# Patient Record
Sex: Male | Born: 2000 | Race: Black or African American | Hispanic: No | Marital: Single | Smoking: Current every day smoker
Health system: Southern US, Community
[De-identification: ages and names within clinical notes are randomized; demographics above are authoritative.]

## PROBLEM LIST (undated history)

## (undated) DIAGNOSIS — F419 Anxiety disorder, unspecified: Secondary | ICD-10-CM

## (undated) HISTORY — PX: NO PAST SURGERIES: SHX2092

---

## 2019-05-21 ENCOUNTER — Other Ambulatory Visit: Payer: Self-pay

## 2019-05-21 ENCOUNTER — Ambulatory Visit
Admission: EM | Admit: 2019-05-21 | Discharge: 2019-05-21 | Disposition: A | Discharge status: Other Acute Inpatient Hospital | Payer: Medicaid Other | Attending: Family Medicine | Admitting: Family Medicine

## 2019-05-21 ENCOUNTER — Ambulatory Visit: Payer: Medicaid Other

## 2019-05-21 ENCOUNTER — Encounter: Payer: Self-pay | Admitting: Emergency Medicine

## 2019-05-21 DIAGNOSIS — S61210A Laceration without foreign body of right index finger without damage to nail, initial encounter: Secondary | ICD-10-CM | POA: Diagnosis not present

## 2019-05-21 DIAGNOSIS — Z23 Encounter for immunization: Secondary | ICD-10-CM | POA: Diagnosis not present

## 2019-05-21 DIAGNOSIS — S62650B Nondisplaced fracture of medial phalanx of right index finger, initial encounter for open fracture: Secondary | ICD-10-CM | POA: Insufficient documentation

## 2019-05-21 HISTORY — DX: Anxiety disorder, unspecified: F41.9

## 2019-05-21 MED ORDER — ACETAMINOPHEN 500 MG PO TABS
1000.0000 mg | ORAL_TABLET | Freq: Once | ORAL | Status: AC
  Administered 2019-05-21: 1000 mg via ORAL

## 2019-05-21 MED ORDER — TETANUS-DIPHTH-ACELL PERTUSSIS 5-2.5-18.5 LF-MCG/0.5 IM SUSP
0.5000 mL | Freq: Once | INTRAMUSCULAR | Status: AC
  Administered 2019-05-21: 0.5 mL via INTRAMUSCULAR

## 2019-05-21 NOTE — ED Provider Notes (Signed)
[facility] ____________________________________________  Time seen: Approximately 2:03 PM  I have reviewed the triage vital signs and the nursing notes.   HISTORY  Chief Complaint Laceration (right index)   HPI Kirk Walls is a 19 y.o. male presenting for evaluation of laceration to right index finger that occurred on a band saw just prior to arrival.  States he had stopped by work and while talking he accidentally placed his hand wrong and was hit by the soft.  Reports last tetanus immunization is greater than 5 years ago.  Reports pain to index finger.  Denies alleviating measures.  Pain worse with direct palpation.  Denies paresthesias or loss of range of motion.  Denies recent sickness or fevers.  Reports otherwise doing well.    Past Medical History:  Diagnosis Date  . Anxiety     There are no problems to display for this patient.   Past Surgical History:  Procedure Laterality Date  . NO PAST SURGERIES       No current facility-administered medications for this encounter. No current outpatient medications on file.  Allergies Patient has no known allergies.  Family History  Problem Relation Age of Onset  . Other Mother        unknown medical history  . Diabetes Father     Social History Social History   Tobacco Use  . Smoking status: Never Smoker  . Smokeless tobacco: Never Used  Substance Use Topics  . Alcohol use: Yes    Comment: rarely  . Drug use: Never    Review of Systems Constitutional: No fever Musculoskeletal: Right finger pain. Skin: Right index finger laceration and pain.  ____________________________________________   PHYSICAL EXAM:  VITAL SIGNS: ED Triage Vitals  Enc Vitals Group     BP 05/21/19 1243 135/84     Pulse Rate 05/21/19 1243 64     Resp 05/21/19 1243 18     Temp 05/21/19 1243 98.3 F (36.8 C)     Temp Source 05/21/19 1243 Oral     SpO2 05/21/19 1243 100 %     Weight 05/21/19 1242 200 lb (90.7  kg)     Height 05/21/19 1242 5\' 7"  (1.702 m)     Head Circumference --      Peak Flow --      Pain Score 05/21/19 1242 8     Pain Loc --      Pain Edu? --      Excl. in GC? --     Constitutional: Alert and oriented. Well appearing and in no acute distress. Eyes: Conjunctivae are normal.  ENT      Head: Normocephalic and atraumatic. Musculoskeletal: Steady..  Neurologic:  Normal speech and language. Skin:  Skin is warm, dry Except: Right index finger dorsal aspect moon shaped laceration extending from middle proximal phalanx to mid middle phalanx with diffuse mild to moderate pain and mild active bleeding, normal distal sensation capillary refill of index finger, good distal resisted flexion without pain, good distal resisted extension but with pain, right hand otherwise nontender. Psychiatric: Mood and affect are normal. Speech and behavior are normal. Patient exhibits appropriate insight and judgment   ___________________________________________   LABS (all labs ordered are listed, but only abnormal results are displayed)  Labs Reviewed - No data to display ____________________________________________  RADIOLOGY  DG Finger Index Right  Result Date: 05/21/2019 CLINICAL DATA:  Laceration with band saw, initial encounter EXAM: RIGHT INDEX FINGER 2+V COMPARISON:  None. FINDINGS: Soft tissue defect is  noted consistent with the given clinical history. On the frontal view a few tiny bony fragments are noted likely related to injury of the distal aspect of the second proximal phalanx. Additionally on the lateral projection there is a defect noted dorsally in the distal aspect of the second proximal phalanx consistent with the recent injury. IMPRESSION: Soft tissue and bony injury as described in the distal aspect of second proximal phalanx. Electronically Signed   By: Inez Catalina M.D.   On: 05/21/2019 13:25   ____________________________________________   PROCEDURES Procedures    INITIAL IMPRESSION / ASSESSMENT AND PLAN / ED COURSE  Pertinent labs & imaging results that were available during my care of the patient were reviewed by me and considered in my medical decision making (see chart for details).  Right index finger open fracture laceration of dominant hand with pain with extension.  Recommend further evaluation in emergency room.  Patient states that he will go to [facility].  Finger dressing applied and finger splint.  Tetanus immunization was updated in urgent care.  Patient states that his friend will drive him to [facility].  Patient agrees with plan and stable at discharge.  ____________________________________________   FINAL CLINICAL IMPRESSION(S) / ED DIAGNOSES  Final diagnoses:  Laceration of right index finger without foreign body without damage to nail, initial encounter  Open nondisplaced fracture of middle phalanx of right index finger, initial encounter     ED Discharge Orders    None       Note: This dictation was prepared with Dragon dictation along with smaller phrase technology. Any transcriptional errors that result from this process are unintentional.         Marylene Land, NP 05/21/19 1446

## 2019-05-21 NOTE — Discharge Instructions (Addendum)
Go directly to emergency room as discussed.  °

## 2019-05-21 NOTE — ED Triage Notes (Signed)
Patient in today after lacerating his right index finger with a band saw. Patient's last Tdap >5 years ago.

## 2020-05-10 ENCOUNTER — Ambulatory Visit: Payer: Medicaid Other | Admitting: Physician Assistant

## 2020-05-10 ENCOUNTER — Other Ambulatory Visit: Payer: Self-pay

## 2020-05-10 DIAGNOSIS — Z113 Encounter for screening for infections with a predominantly sexual mode of transmission: Secondary | ICD-10-CM

## 2020-05-10 DIAGNOSIS — N341 Nonspecific urethritis: Secondary | ICD-10-CM

## 2020-05-10 LAB — GRAM STAIN

## 2020-05-10 MED ORDER — DOXYCYCLINE HYCLATE 100 MG PO TABS: 100.0000 mg | ORAL_TABLET | Freq: Two times a day (BID) | ORAL | 0 refills | Status: AC

## 2020-05-11 ENCOUNTER — Encounter: Payer: Self-pay | Admitting: Physician Assistant

## 2020-05-11 NOTE — Progress Notes (Signed)
[facility] Department STI clinic/screening visit  Subjective:  Steel Kerney is a 20 y.o. male being seen today for an STI screening visit. The patient reports they do not have symptoms.    Patient has the following medical conditions:  There are no problems to display for this patient.    Chief Complaint  Patient presents with  . SEXUALLY TRANSMITTED DISEASE    screening    HPI  Patient reports that he is not having any symptoms but has a new partner and would like a screening.  Denies chronic conditions, surgeries and regular medicines.  States last HIV test was 2 yr ago and last void prior to sample collection for Gram stain was about 1 hr ago.   See flowsheet for further details and programmatic requirements.    The following portions of the patient's history were reviewed and updated as appropriate: allergies, current medications, past medical history, past social history, past surgical history and problem list.  Objective:  There were no vitals filed for this visit.  Physical Exam Constitutional:      General: He is not in acute distress.    Appearance: Normal appearance.  HENT:     Head: Normocephalic and atraumatic.     Comments: No nits,lice, or hair loss. No cervical, supraclavicular or axillary adenopathy.    Mouth/Throat:     Mouth: Mucous membranes are moist.     Pharynx: Oropharynx is clear. No oropharyngeal exudate or posterior oropharyngeal erythema.  Eyes:     Conjunctiva/sclera: Conjunctivae normal.  Pulmonary:     Effort: Pulmonary effort is normal.  Abdominal:     Palpations: Abdomen is soft. There is no mass.     Tenderness: There is no abdominal tenderness. There is no guarding or rebound.  Genitourinary:    Penis: Normal.      Testes: Normal.     Comments: Pubic area without nits, lice, hair loss, edema, erythema, lesions and inguinal adenopathy. Penis circumcised without rash, lesions and discharge at meatus. Testicles  descended bilaterally,nt, no masses or edema. Musculoskeletal:     Cervical back: Neck supple. No tenderness.  Skin:    General: Skin is warm and dry.     Findings: No bruising, erythema, lesion or rash.  Neurological:     Mental Status: He is alert and oriented to person, place, and time.  Psychiatric:        Mood and Affect: Mood normal.        Behavior: Behavior normal.        Thought Content: Thought content normal.        Judgment: Judgment normal.       Assessment and Plan:  Dakhari Zuver is a 20 y.o. male presenting to the [facility] Department for STI screening  1. Screening for STD (sexually transmitted disease) Patient into clinic without symptoms. Reviewed with patient Gram stain results and need for treatment. Rec condoms with all sex. Await test results.  Counseled that RN will call if needs to RTC for treatment once results are back. - Gram stain - Gonococcus culture - HIV/HCV [facility] - Syphilis Serology, [facility] - Gonococcus culture  2. NGU (nongonococcal urethritis) Will treat for NGU with Doxycycline 100 mg #14 1 po BID for 7 days. No sex for 14 days and until after partner completes treatment. Call with questions or concerns. - doxycycline (VIBRA-TABS) 100 MG tablet; Take 1 tablet (100 mg total) by mouth 2 (two) times daily for  7 days.  Dispense: 14 tablet; Refill: 0     No follow-ups on file.  No future appointments.  Matt Holmes, PA

## 2020-05-12 NOTE — Progress Notes (Signed)
Chart reviewed by Pharmacist  Suzanne Walker PharmD, Contract Pharmacist at [facility]  

## 2020-05-14 LAB — GONOCOCCUS CULTURE

## 2020-05-15 LAB — HM HEPATITIS C SCREENING LAB: HM Hepatitis Screen: NEGATIVE

## 2020-05-15 LAB — HM HIV SCREENING LAB: HM HIV Screening: NEGATIVE

## 2020-09-23 IMAGING — CR DG FINGER INDEX 2+V*R*
3 series · 3 of 3 positions shown · non-contrast
Comparison: None.

CLINICAL DATA: Laceration with band saw, initial encounter

EXAM:
RIGHT INDEX FINGER 2+V

[finger ap]
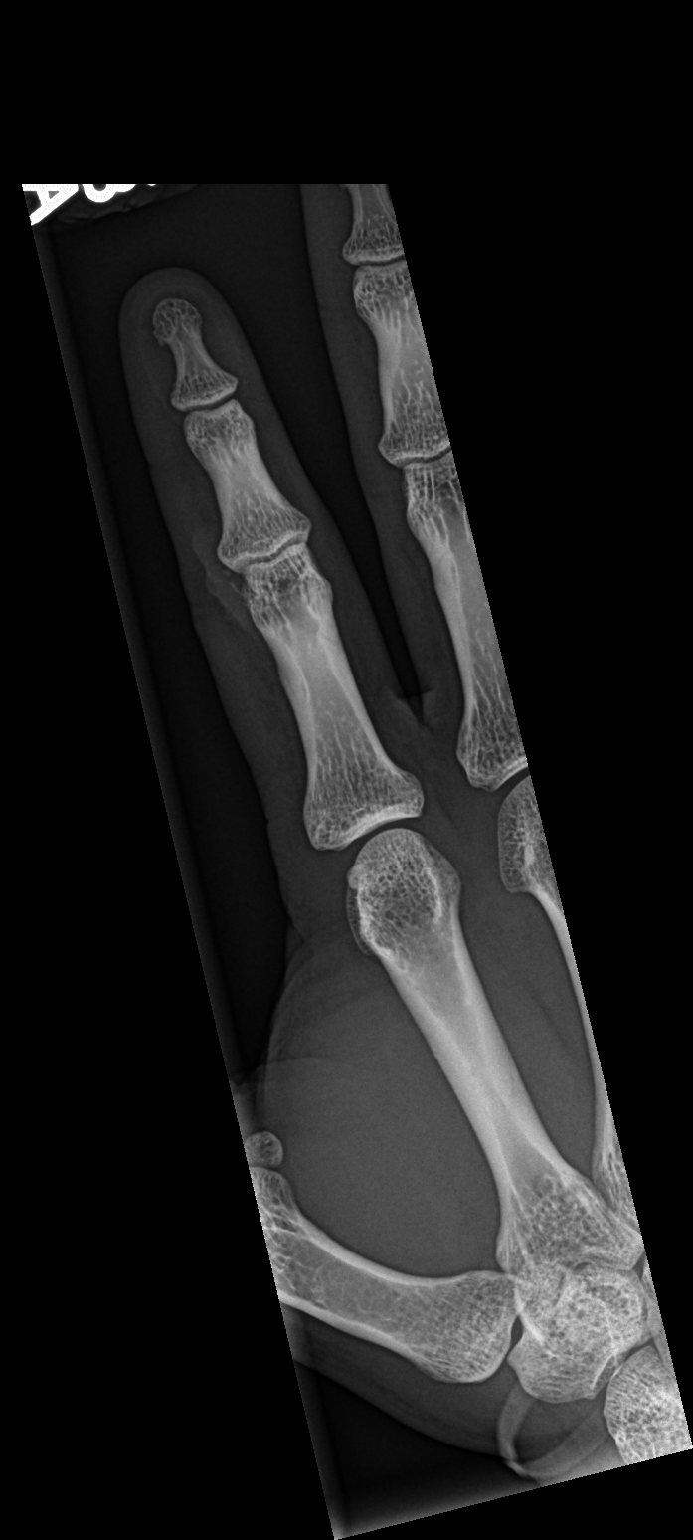

[finger obl]
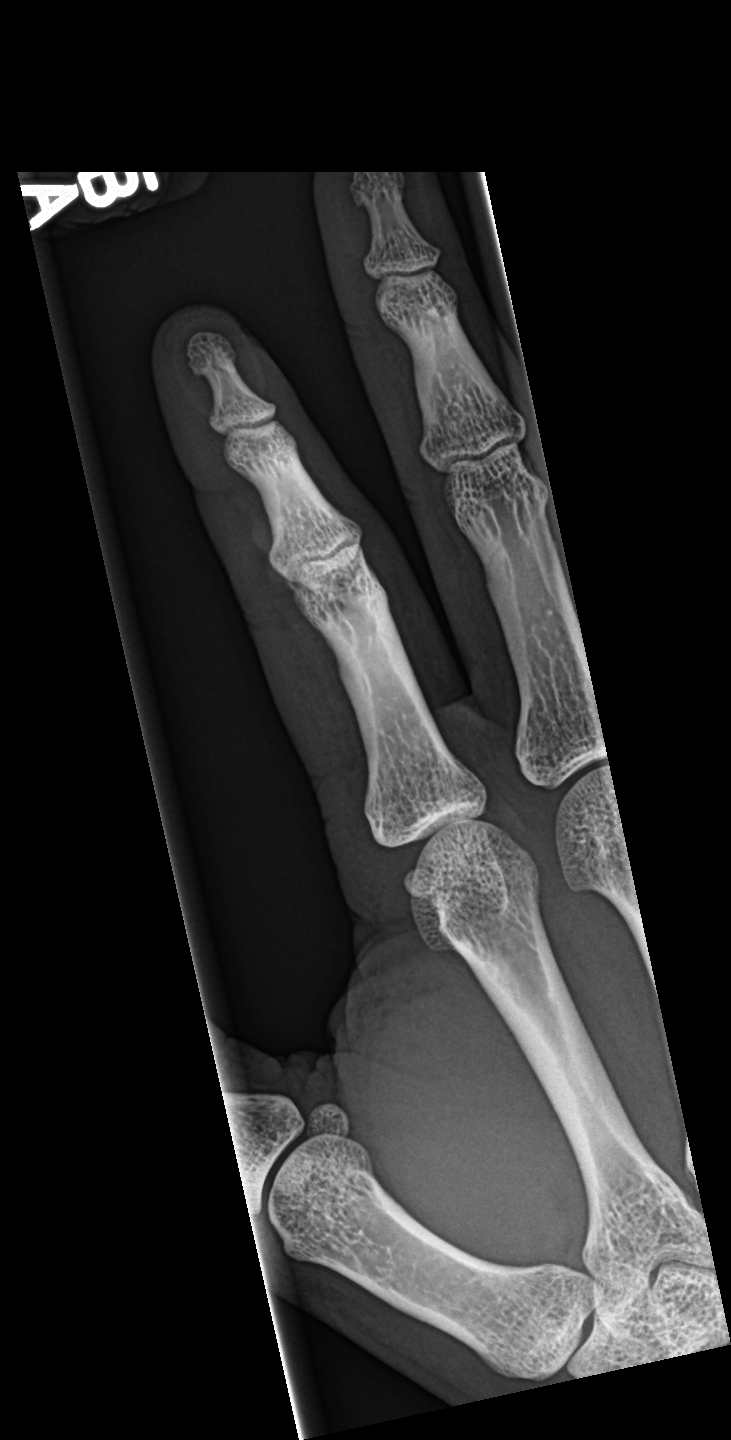

[finger lat]
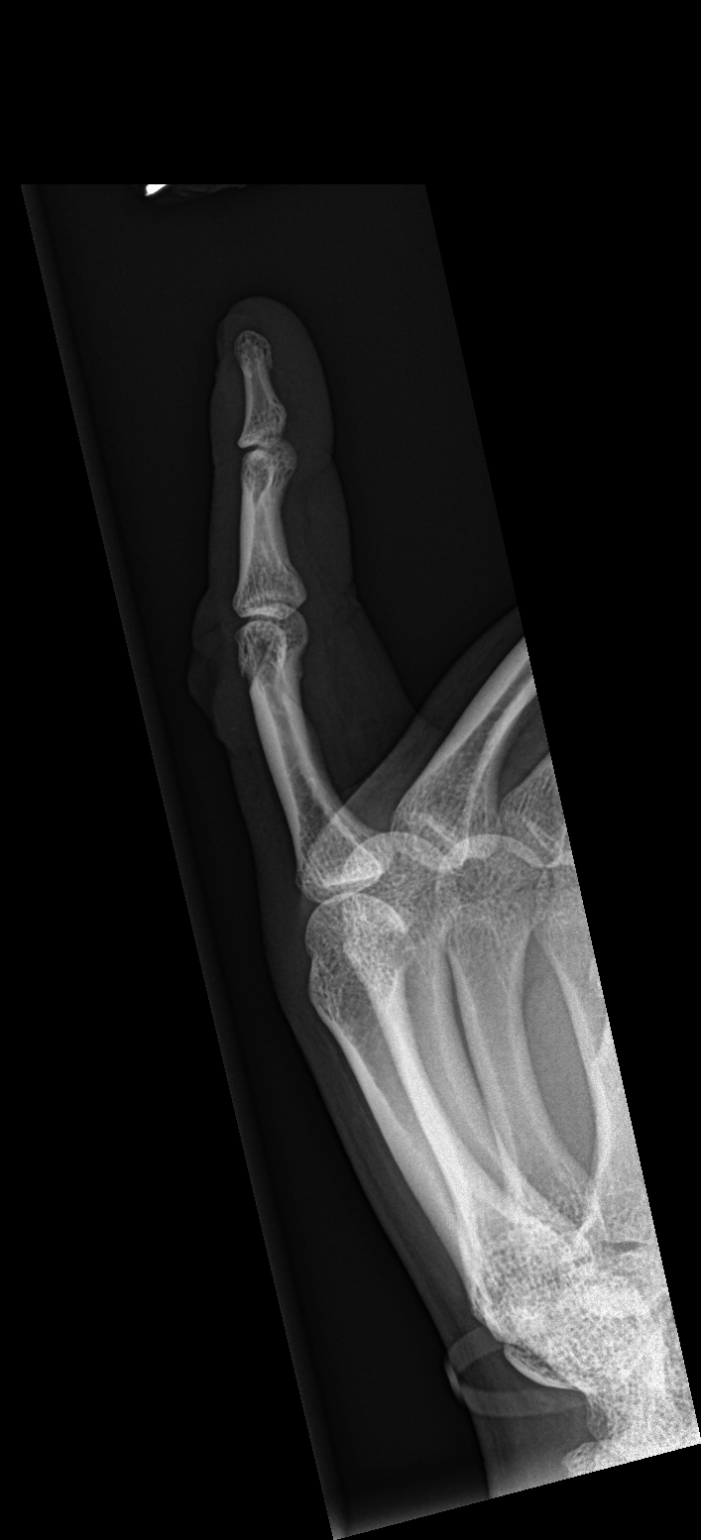

[3 of 3 positions shown; findings below may reference images not displayed]

FINDINGS: Soft tissue defect is noted consistent with the given clinical
history. On the frontal view a few tiny bony fragments are noted
likely related to injury of the distal aspect of the second proximal
phalanx. Additionally on the lateral projection there is a defect
noted dorsally in the distal aspect of the second proximal phalanx
consistent with the recent injury.
IMPRESSION: Soft tissue and bony injury as described in the distal aspect of
second proximal phalanx.

## 2023-02-10 ENCOUNTER — Emergency Department: Payer: Medicaid Other

## 2023-02-10 ENCOUNTER — Emergency Department
Admission: EM | Admit: 2023-02-10 | Discharge: 2023-02-10 | Disposition: A | Discharge status: Home / Self Care | Payer: Medicaid Other | Attending: Emergency Medicine | Admitting: Emergency Medicine

## 2023-02-10 ENCOUNTER — Other Ambulatory Visit: Payer: Self-pay

## 2023-02-10 DIAGNOSIS — K279 Peptic ulcer, site unspecified, unspecified as acute or chronic, without hemorrhage or perforation: Secondary | ICD-10-CM | POA: Diagnosis not present

## 2023-02-10 DIAGNOSIS — Z20822 Contact with and (suspected) exposure to covid-19: Secondary | ICD-10-CM | POA: Insufficient documentation

## 2023-02-10 DIAGNOSIS — R059 Cough, unspecified: Secondary | ICD-10-CM | POA: Diagnosis present

## 2023-02-10 DIAGNOSIS — J209 Acute bronchitis, unspecified: Secondary | ICD-10-CM | POA: Diagnosis not present

## 2023-02-10 LAB — RESP PANEL BY RT-PCR (RSV, FLU A&B, COVID)  RVPGX2
Influenza A by PCR: NEGATIVE
Influenza B by PCR: NEGATIVE
Resp Syncytial Virus by PCR: NEGATIVE
SARS Coronavirus 2 by RT PCR: NEGATIVE

## 2023-02-10 MED ORDER — PANTOPRAZOLE SODIUM 40 MG PO TBEC: 40.0000 mg | DELAYED_RELEASE_TABLET | Freq: Every day | ORAL | 1 refills | Status: AC

## 2023-02-10 MED ORDER — BENZONATATE 100 MG PO CAPS: 100.0000 mg | ORAL_CAPSULE | Freq: Three times a day (TID) | ORAL | 0 refills | Status: AC | PRN

## 2023-02-10 MED ORDER — AZITHROMYCIN 250 MG PO TABS: ORAL_TABLET | ORAL | 0 refills | Status: AC

## 2023-02-10 NOTE — ED Provider Notes (Signed)
 [facility] Provider Note    Event Date/Time   First MD Initiated Contact with Patient 02/10/23 1452     (approximate)   History   Cough and lung pain   HPI  Kirk Walls is a 23 y.o. male complains of long pain for 2 to 3 months along with coughing up yellow phlegm.  States yellow phlegm's been for 2 to 3 days has streaks of blood.  Also some epigastric pain.  States his been going on also for about 3 months.  States stools were dark but he also took Pepto-Bismol.  Denies fever or chills.  No chest pain/shortness of breath at this time.  Patient is vaping      Physical Exam   Triage Vital Signs: ED Triage Vitals  Encounter Vitals Group     BP 02/10/23 1331 124/81     Systolic BP Percentile --      Diastolic BP Percentile --      Pulse Rate 02/10/23 1331 71     Resp 02/10/23 1331 20     Temp 02/10/23 1331 98.2 F (36.8 C)     Temp Source 02/10/23 1331 Oral     SpO2 02/10/23 1331 98 %     Weight 02/10/23 1328 180 lb (81.6 kg)     Height 02/10/23 1328 5' 6 (1.676 m)     Head Circumference --      Peak Flow --      Pain Score 02/10/23 1328 2     Pain Loc --      Pain Education --      Exclude from Growth Chart --     Most recent vital signs: Vitals:   02/10/23 1331  BP: 124/81  Pulse: 71  Resp: 20  Temp: 98.2 F (36.8 C)  SpO2: 98%     General: Awake, no distress.   CV:  Good peripheral perfusion. regular rate and  rhythm Resp:  Normal effort. Lungs CTA Abd:  No distention.  Nontender Other:      ED Results / Procedures / Treatments   Labs (all labs ordered are listed, but only abnormal results are displayed) Labs Reviewed  RESP PANEL BY RT-PCR (RSV, FLU A&B, COVID)  [id number]     EKG     RADIOLOGY Chest x-ray    PROCEDURES:   Procedures Chief Complaint  Patient presents with   Cough   lung pain      MEDICATIONS ORDERED IN ED: Medications - No data to display   IMPRESSION / MDM / ASSESSMENT AND  PLAN / ED COURSE  I reviewed the triage vital signs and the nursing notes.                              Differential diagnosis includes, but is not limited to, COVID, influenza, RSV, CAP, acute bronchitis, lung cancer, peptic ulcer disease, epigastric pain  Patient's presentation is most consistent with acute illness / injury with system symptoms.   Respiratory panel reassuring, chest x-ray independently reviewed interpreted by me as being negative for any acute abnormality  Since the patient appears well, vitals are stable, explained to him his chronic problems with abdominal pain for 3 months along with a cough for 3 months can be addressed by his PCP.  Put in an recommendation for GI consult.  I gave him a prescription for Protonix , Z-Pak, and something for his cough.  He is to follow-up  with his regular doctor or PCP option as given on his discharge papers.  He is in agreement treatment plan.  Discharged stable condition.  Return precautions discussed, vaping cessation discussed      FINAL CLINICAL IMPRESSION(S) / ED DIAGNOSES   Final diagnoses:  Acute bronchitis, unspecified organism  PUD (peptic ulcer disease)     Rx / DC Orders   ED Discharge Orders          Ordered    pantoprazole  (PROTONIX ) 40 MG tablet  Daily        02/10/23 1506    azithromycin  (ZITHROMAX  Z-PAK) 250 MG tablet        02/10/23 1506    benzonatate  (TESSALON  PERLES) 100 MG capsule  3 times daily PRN        02/10/23 1506             Note:  This document was prepared using Dragon voice recognition software and may include unintentional dictation errors.    Gasper Devere ORN, PA-C 02/10/23 1556    Arlander Charleston, MD 02/10/23 [phone]

## 2023-02-10 NOTE — ED Triage Notes (Signed)
 Pt to ED for R lung pain since 2-3 months and coughing yellow phlegm since 2-3 days. No fevers. No SOB. Also states has had intermittent upper abdominal pain that moves around. No abdominal pain at this time. Denies NVD and urinary symptoms. Counseled to quit vaping. Lung pain is worse with coughing, mostly just uncomfortable.

## 2023-03-05 ENCOUNTER — Encounter: Payer: Self-pay | Admitting: Family Medicine

## 2023-03-05 ENCOUNTER — Ambulatory Visit: Payer: Medicaid Other | Admitting: Family Medicine

## 2023-03-05 DIAGNOSIS — Z113 Encounter for screening for infections with a predominantly sexual mode of transmission: Secondary | ICD-10-CM | POA: Diagnosis not present

## 2023-03-05 LAB — HM HIV SCREENING LAB: HM HIV Screening: NEGATIVE

## 2023-03-05 NOTE — Progress Notes (Signed)
 [facility] Department STI clinic [address] [place] [address] Main phone: [phone]  STI screening visit  Subjective:  Kirk Walls is a 23 y.o. male being seen today for an STI screening visit. The patient reports they do not have symptoms.    Patient has the following medical conditions:  There are no active problems to display for this patient.   Chief Complaint  Patient presents with   SEXUALLY TRANSMITTED DISEASE    HPI HPI Patient reports to clinic for STI testing- asymptomatic  STI screening history: Last HIV test per patient/review of record was  Lab Results  Component Value Date   HMHIVSCREEN Negative - Validated 05/15/2020   No results found for: "HIV"  Last HEPC test per patient/review of record was  Lab Results  Component Value Date   HMHEPCSCREEN Negative-Validated 05/15/2020   No components found for: "HEPC"   Last HEPB test per patient/review of record was No components found for: "HMHEPBSCREEN"   Fertility: Does the patient or their partner desires a pregnancy in the next year? No  Screening for MPX risk: Does the patient have an unexplained rash? No Is the patient MSM? No Does the patient endorse multiple sex partners or anonymous sex partners? No Did the patient have close or sexual contact with a person diagnosed with MPX? No Has the patient traveled outside the [place] where MPX is endemic? No Is there a high clinical suspicion for MPX-- evidenced by one of the following No  -Unlikely to be chickenpox  -Lymphadenopathy  -Rash that present in same phase of evolution on any given body part   See flowsheet for further details and programmatic requirements.   Immunization History  Administered Date(s) Administered   Tdap 05/21/2019     The following portions of the patient's history were reviewed and updated as appropriate: allergies, current medications, past medical history, past social history,  past surgical history and problem list.  Objective:  There were no vitals filed for this visit.  Physical Exam Vitals and nursing note reviewed.  Constitutional:      Appearance: Normal appearance.  HENT:     Head: Normocephalic and atraumatic.     Mouth/Throat:     Mouth: Mucous membranes are moist.     Pharynx: No oropharyngeal exudate or posterior oropharyngeal erythema.  Eyes:     General:        Right eye: No discharge.        Left eye: No discharge.     Conjunctiva/sclera:     Right eye: Right conjunctiva is not injected. No exudate.    Left eye: Left conjunctiva is not injected. No exudate. Pulmonary:     Effort: Pulmonary effort is normal.  Abdominal:     General: Abdomen is flat.     Palpations: Abdomen is soft. There is no hepatomegaly or mass.     Tenderness: There is no abdominal tenderness. There is no rebound.  Genitourinary:    Comments: Declined genital exam- asymptomatic Lymphadenopathy:     Cervical: No cervical adenopathy.     Upper Body:     Right upper body: No supraclavicular or axillary adenopathy.     Left upper body: No supraclavicular or axillary adenopathy.  Skin:    General: Skin is warm and dry.  Neurological:     Mental Status: He is alert and oriented to person, place, and time.     Assessment and Plan:  Kirk Walls is a 23 y.o. male  presenting to the [facility] Department for STI screening  1. Screening for venereal disease (Primary)  - HIV [facility] - Syphilis Serology,  Lab - Chlamydia/GC NAA, Confirmation   Patient does not have STI symptoms Patient accepted all screenings including  urine GC/Chlamydia, and blood work for HIV/Syphilis. Patient meets criteria for HepB screening? No. Ordered? not applicable Patient meets criteria for HepC screening? No. Ordered? not applicable Recommended condom use with all sex Discussed importance of condom use for STI prevention  Treat positive test results per  standing order. Discussed time line for [facility] results and that patient will be called with positive results and encouraged patient to call if he had not heard in 2 weeks Recommended repeat testing in 3 months with positive results. Recommended returning for continued or worsening symptoms.   Return if symptoms worsen or fail to improve, for STI screening.  No future appointments.  Lenice Llamas, [place]

## 2023-03-10 LAB — CHLAMYDIA/GC NAA, CONFIRMATION
Chlamydia trachomatis, NAA: NEGATIVE
Neisseria gonorrhoeae, NAA: NEGATIVE

## 2023-05-03 ENCOUNTER — Emergency Department (HOSPITAL_COMMUNITY)
Admission: EM | Admit: 2023-05-03 | Discharge: 2023-05-04 | Disposition: A | Discharge status: Home / Self Care | Attending: Emergency Medicine | Admitting: Emergency Medicine

## 2023-05-03 ENCOUNTER — Other Ambulatory Visit: Payer: Self-pay

## 2023-05-03 DIAGNOSIS — S01511A Laceration without foreign body of lip, initial encounter: Secondary | ICD-10-CM | POA: Diagnosis present

## 2023-05-03 DIAGNOSIS — W1830XA Fall on same level, unspecified, initial encounter: Secondary | ICD-10-CM | POA: Diagnosis not present

## 2023-05-03 NOTE — ED Triage Notes (Signed)
 Pt has a laceration to the left side of upper lip. Happened about an hour and a half ago. In the corner of mouth, approx 1.5cm and is gaping some.

## 2023-05-04 ENCOUNTER — Encounter (HOSPITAL_COMMUNITY): Payer: Self-pay

## 2023-05-04 MED ORDER — LIDOCAINE HCL (PF) 1 % IJ SOLN
5.0000 mL | Freq: Once | INTRAMUSCULAR | Status: AC
  Administered 2023-05-04: 5 mL
  Filled 2023-05-04: qty 5

## 2023-05-04 MED ORDER — PENICILLIN V POTASSIUM 500 MG PO TABS: 500.0000 mg | ORAL_TABLET | Freq: Three times a day (TID) | ORAL | 0 refills | Status: AC

## 2023-05-04 NOTE — ED Notes (Signed)
 Pt told registration he was leaving

## 2023-05-04 NOTE — Discharge Instructions (Addendum)
 Take penicillin as prescribed and complete the full course. Rinse mouth with salt water or Listerine after every meal. Suture removal at urgent care in 5 days.

## 2023-05-04 NOTE — ED Notes (Signed)
 Pt seen leaving lobby

## 2023-05-04 NOTE — ED Provider Notes (Signed)
 Kirk Walls Provider Note   CSN: [id number] Arrival date & time: 05/03/23  2347     History  Chief Complaint  Patient presents with   Laceration    Kirk Walls is a 23 y.o. male.  23 year old male presents emergency room with laceration to the left upper lip.  Patient states that he fell and hit his mouth on a step just prior to arrival. No dental injury.  Bleeding is controlled.  Last tetanus less than 5 years ago.  No other injuries, complaints, concerns.       Home Medications Prior to Admission medications   Medication Sig Start Date End Date Taking? Authorizing Provider  penicillin v potassium (VEETID) 500 MG tablet Take 1 tablet (500 mg total) by mouth 3 (three) times daily for 10 days. 05/04/23 05/14/23 Yes Darlis Eisenmenger, PA-C  azithromycin  (ZITHROMAX  Z-PAK) 250 MG tablet 2 pills today then 1 pill a day for 4 days 02/10/23   Shann Darnel Rufino Coulter, PA-C  benzonatate  (TESSALON  PERLES) 100 MG capsule Take 1 capsule (100 mg total) by mouth 3 (three) times daily as needed for cough. 02/10/23 02/10/24  Delsie Figures, PA-C  pantoprazole  (PROTONIX ) 40 MG tablet Take 1 tablet (40 mg total) by mouth daily. 02/10/23 02/10/24  Delsie Figures, PA-C      Allergies    Patient has no known allergies.    Review of Systems   Review of Systems Negative except as per HPI Physical Exam Updated Vital Signs BP 128/60 (BP Location: Right Arm)   Pulse 79   Temp 98.3 F (36.8 C)   Resp 16   Ht 5\' 6"  (1.676 m)   Wt 83.9 kg   SpO2 100%   BMI 29.86 kg/m  Physical Exam Vitals and nursing note reviewed.  Constitutional:      General: He is not in acute distress.    Appearance: He is well-developed. He is not diaphoretic.  HENT:     Head: Normocephalic.     Mouth/Throat:     Dentition: Normal dentition. No dental tenderness.   Pulmonary:     Effort: Pulmonary effort is normal.  Musculoskeletal:     Cervical back: Normal range of motion and neck  supple. No tenderness.  Skin:    General: Skin is warm and dry.     Findings: No erythema or rash.  Neurological:     Mental Status: He is alert and oriented to person, place, and time.  Psychiatric:        Behavior: Behavior normal.     ED Results / Procedures / Treatments   Labs (all labs ordered are listed, but only abnormal results are displayed) Labs Reviewed - No data to display  EKG None  Radiology No results found.  Procedures .Laceration Repair  Date/Time: 05/04/2023 1:24 AM  Performed by: Darlis Eisenmenger, PA-C Authorized by: Darlis Eisenmenger, PA-C   Consent:    Consent obtained:  Verbal   Consent given by:  Patient   Risks, benefits, and alternatives were discussed: yes     Risks discussed:  Infection, pain, poor cosmetic result and poor wound healing   Alternatives discussed:  No treatment Universal protocol:    Patient identity confirmed:  Verbally with patient Anesthesia:    Anesthesia method:  Local infiltration   Local anesthetic:  Lidocaine 1% w/o epi Laceration details:    Location:  Lip   Lip location:  Upper exterior lip and upper interior lip  Length (cm):  1.5   Depth (mm):  3 Pre-procedure details:    Preparation:  Patient was prepped and draped in usual sterile fashion Exploration:    Limited defect created (wound extended): no     Wound extent: no signs of injury   Treatment:    Area cleansed with:  Saline   Amount of cleaning:  Extensive   Irrigation solution:  Sterile saline Skin repair:    Repair method:  Sutures   Suture size:  5-0 and 4-0   Suture material:  Prolene and fast-absorbing gut   Suture technique:  Simple interrupted   Number of sutures:  4 Approximation:    Vermilion border well-aligned: yes   Repair type:    Repair type:  Intermediate Post-procedure details:    Dressing:  Open (no dressing)   Procedure completion:  Tolerated well, no immediate complications     Medications Ordered in ED Medications   lidocaine (PF) (XYLOCAINE) 1 % injection 5 mL (5 mLs Infiltration Given 05/04/23 0134)    ED Course/ Medical Decision Making/ A&P                                 Medical Decision Making Risk Prescription drug management.   23 year old male presents with complaint of laceration to the left upper lip, does involve the vermilion border, no dental injury.  Wound was irrigated, anesthetized, closed.  Patient is discharged with penicillin.  Tetanus is up-to-date.  Recommend return to urgent care in 3 to 5 days to have the single Prolene suture removed from the vermilion border, remaining sutures will dissolve.        Final Clinical Impression(s) / ED Diagnoses Final diagnoses:  Lip laceration, initial encounter    Rx / DC Orders ED Discharge Orders          Ordered    penicillin v potassium (VEETID) 500 MG tablet  3 times daily        05/04/23 0210              Darlis Eisenmenger, PA-C 05/04/23 1610    Alissa April, MD 05/04/23 [phone]

## 2023-05-10 ENCOUNTER — Other Ambulatory Visit: Payer: Self-pay

## 2023-05-10 ENCOUNTER — Emergency Department (HOSPITAL_COMMUNITY)
Admission: EM | Admit: 2023-05-10 | Discharge: 2023-05-10 | Disposition: A | Discharge status: Home / Self Care | Attending: Emergency Medicine | Admitting: Emergency Medicine

## 2023-05-10 ENCOUNTER — Encounter (HOSPITAL_COMMUNITY): Payer: Self-pay

## 2023-05-10 DIAGNOSIS — Z4802 Encounter for removal of sutures: Secondary | ICD-10-CM | POA: Insufficient documentation

## 2023-05-10 NOTE — ED Triage Notes (Signed)
 Pt here to get stitches removed that were placed on 4/27

## 2023-05-10 NOTE — ED Provider Notes (Signed)
 [facility] Provider Note   CSN: [id number] Arrival date & time: 05/10/23  2047     History Chief Complaint  Patient presents with   Suture / Staple Removal    Kirk Walls is a 23 y.o. male.  Patient presents to the emergency department today for suture removal.  He reports that he had sutures placed about 1 week ago following a fall on 2 steps resulting in a laceration to his left upper lip.  At that time, suture repair was used with a combination of absorbable and nonabsorbable sutures.  He was advised to have nonabsorbable suture removed at wound check.   Suture / Staple Removal       Home Medications Prior to Admission medications   Medication Sig Start Date End Date Taking? Authorizing Provider  azithromycin  (ZITHROMAX  Z-PAK) 250 MG tablet 2 pills today then 1 pill a day for 4 days 02/10/23   Shann Darnel Rufino Coulter, PA-C  benzonatate  (TESSALON  PERLES) 100 MG capsule Take 1 capsule (100 mg total) by mouth 3 (three) times daily as needed for cough. 02/10/23 02/10/24  Delsie Figures, PA-C  pantoprazole  (PROTONIX ) 40 MG tablet Take 1 tablet (40 mg total) by mouth daily. 02/10/23 02/10/24  Delsie Figures, PA-C  penicillin  v potassium (VEETID) 500 MG tablet Take 1 tablet (500 mg total) by mouth 3 (three) times daily for 10 days. 05/04/23 05/14/23  Darlis Eisenmenger, PA-C      Allergies    Patient has no known allergies.    Review of Systems   Review of Systems  Skin:  Positive for wound.  All other systems reviewed and are negative.   Physical Exam Updated Vital Signs BP 113/78 (BP Location: Right Arm)   Pulse 71   Temp 98.7 F (37.1 C)   Resp 16   SpO2 99%  Physical Exam Vitals and nursing note reviewed.  Constitutional:      General: He is not in acute distress.    Appearance: Normal appearance. He is not ill-appearing.  Skin:    General: Skin is warm.     Findings: Lesion present.     Comments: Lesion to the left upper lip appears  to be healing well. No evidence of wound dehiscence.  Neurological:     Mental Status: He is alert.     ED Results / Procedures / Treatments   Labs (all labs ordered are listed, but only abnormal results are displayed) Labs Reviewed - No data to display  EKG None  Radiology No results found.  Procedures Suture Removal  Date/Time: 05/10/2023 10:46 PM  Performed by: Jeremy Mclamb A, PA-C Authorized by: Avelynn Sellin A, PA-C   Consent:    Consent obtained:  Verbal   Consent given by:  Patient   Risks discussed:  Bleeding, pain and wound separation Universal protocol:    Patient identity confirmed:  Arm band and verbally with patient Location:    Location:  Head/neck   Head/neck location: lip. Procedure details:    Wound appearance:  No signs of infection, nonpurulent and good wound healing   Number of sutures removed:  1   Number of staples removed:  0 Post-procedure details:    Post-removal:  No dressing applied   Procedure completion:  Tolerated     Medications Ordered in ED Medications - No data to display  ED Course/ Medical Decision Making/ A&P  Medical Decision Making  Problem List / ED Course:  Patient presented to the emergency department today for suture removal.  Sutures were placed on 4/27 onto the left upper lip following a mechanical fall.  Patient denies any signs of infection.  He is requesting to have all sutures removed. Evaluation of wound appears the area is healing well.  The sutures that are placed more internally towards the actual gums appear to still have some time for adequate healing.  Removing sutures would likely lead to dehiscence.  The 1 Prolene suture that is closer towards the vermilion border appears to have adequate closure.  Will remove suture. Patient tolerated removal without any complications.  Vies return precautions such as signs of developing infection.  Discharged home in stable  condition.  Final Clinical Impression(s) / ED Diagnoses Final diagnoses:  Visit for suture removal    Rx / DC Orders ED Discharge Orders     None         Concetta Dee, PA-C 05/10/23 2247    Tonya Fredrickson, MD 05/11/23 1037

## 2023-05-10 NOTE — Discharge Instructions (Signed)
 You were seen today for suture removal.  You had your 1 nonabsorbable suture that was removed today.  Your other areas appear to still be intact and appear to still be needed as there is still some wound healing ongoing.  For any concerns for infection, please return to the emergency department.  Otherwise, please follow up with your primary care provider.

## 2023-08-05 ENCOUNTER — Ambulatory Visit: Attending: Obstetrics and Gynecology

## 2023-08-05 DIAGNOSIS — Z8481 Family history of carrier of genetic disease: Secondary | ICD-10-CM

## 2023-08-05 DIAGNOSIS — Z31440 Encounter of male for testing for genetic disease carrier status for procreative management: Secondary | ICD-10-CM

## 2023-08-16 LAB — HORIZON CUSTOM: REPORT SUMMARY: NEGATIVE
